# Patient Record
Sex: Male | Born: 1970 | Race: White | Hispanic: No | Smoking: Current some day smoker
Health system: Southern US, Community
[De-identification: ages and names within clinical notes are randomized; demographics above are authoritative.]

## PROBLEM LIST (undated history)

## (undated) DIAGNOSIS — IMO0001 Reserved for inherently not codable concepts without codable children: Secondary | ICD-10-CM

## (undated) DIAGNOSIS — Z789 Other specified health status: Secondary | ICD-10-CM

## (undated) HISTORY — PX: OTHER SURGICAL HISTORY: SHX169

---

## 2002-02-14 ENCOUNTER — Emergency Department (HOSPITAL_COMMUNITY): Admission: EM | Admit: 2002-02-14 | Discharge: 2002-02-14 | Payer: Self-pay | Admitting: Emergency Medicine

## 2002-02-14 ENCOUNTER — Encounter: Payer: Self-pay | Admitting: Emergency Medicine

## 2014-01-20 ENCOUNTER — Emergency Department (HOSPITAL_BASED_OUTPATIENT_CLINIC_OR_DEPARTMENT_OTHER)
Admission: EM | Admit: 2014-01-20 | Discharge: 2014-01-20 | Disposition: A | Discharge status: Home / Self Care | Payer: BC Managed Care – PPO | Attending: Emergency Medicine | Admitting: Emergency Medicine

## 2014-01-20 ENCOUNTER — Encounter (HOSPITAL_BASED_OUTPATIENT_CLINIC_OR_DEPARTMENT_OTHER): Payer: Self-pay | Admitting: Emergency Medicine

## 2014-01-20 DIAGNOSIS — K920 Hematemesis: Secondary | ICD-10-CM | POA: Insufficient documentation

## 2014-01-20 DIAGNOSIS — F17200 Nicotine dependence, unspecified, uncomplicated: Secondary | ICD-10-CM | POA: Insufficient documentation

## 2014-01-20 DIAGNOSIS — Z79899 Other long term (current) drug therapy: Secondary | ICD-10-CM | POA: Insufficient documentation

## 2014-01-20 HISTORY — DX: Reserved for inherently not codable concepts without codable children: IMO0001

## 2014-01-20 HISTORY — DX: Other specified health status: Z78.9

## 2014-01-20 LAB — CBC WITH DIFFERENTIAL/PLATELET
Basophils Absolute: 0 10*3/uL (ref 0.0–0.1)
Basophils Relative: 0 % (ref 0–1)
Eosinophils Absolute: 0 10*3/uL (ref 0.0–0.7)
Eosinophils Relative: 0 % (ref 0–5)
HCT: 44.2 % (ref 39.0–52.0)
Hemoglobin: 15.6 g/dL (ref 13.0–17.0)
Lymphocytes Relative: 15 % (ref 12–46)
Lymphs Abs: 1.8 10*3/uL (ref 0.7–4.0)
MCH: 33.4 pg (ref 26.0–34.0)
MCHC: 35.3 g/dL (ref 30.0–36.0)
MCV: 94.6 fL (ref 78.0–100.0)
Monocytes Absolute: 0.7 10*3/uL (ref 0.1–1.0)
Monocytes Relative: 6 % (ref 3–12)
Neutro Abs: 9.5 10*3/uL — ABNORMAL HIGH (ref 1.7–7.7)
Neutrophils Relative %: 79 % — ABNORMAL HIGH (ref 43–77)
Platelets: 225 10*3/uL (ref 150–400)
RBC: 4.67 MIL/uL (ref 4.22–5.81)
RDW: 13.1 % (ref 11.5–15.5)
WBC: 12 10*3/uL — ABNORMAL HIGH (ref 4.0–10.5)

## 2014-01-20 LAB — I-STAT CHEM 8, ED
BUN: 19 mg/dL (ref 6–23)
CALCIUM ION: 1.23 mmol/L (ref 1.12–1.23)
Chloride: 102 mEq/L (ref 96–112)
Creatinine, Ser: 1.1 mg/dL (ref 0.50–1.35)
Glucose, Bld: 104 mg/dL — ABNORMAL HIGH (ref 70–99)
HCT: 49 % (ref 39.0–52.0)
HEMOGLOBIN: 16.7 g/dL (ref 13.0–17.0)
Potassium: 4 mEq/L (ref 3.7–5.3)
SODIUM: 140 meq/L (ref 137–147)
TCO2: 25 mmol/L (ref 0–100)

## 2014-01-20 MED ORDER — PANTOPRAZOLE SODIUM 20 MG PO TBEC: 20.0000 mg | DELAYED_RELEASE_TABLET | Freq: Every day | ORAL | Status: DC

## 2014-01-20 NOTE — Discharge Instructions (Signed)
Protonix as prescribed.  Followup with gastroenterology. The contact information for [facility] has been provided in this discharge summary.  Return to the emergency department if your symptoms substantially worsen, or you develop severe abdominal pain, high fever, or black, tarry stools.   Hematemesis This condition is the vomiting of blood. CAUSES  This can happen if you have a peptic ulcer or an irritation of the throat, stomach, or small bowel. Vomiting over and over again or swallowing blood from a nosebleed, coughing or facial injury can also result in bloody vomit. Anti-inflammatory pain medicines are a common cause of this potentially dangerous condition. The most serious causes of vomiting blood include:  Ulcers (a bacteria called H. pylori is common cause of ulcers).  Clotting problems.  Alcoholism.  Cirrhosis. TREATMENT  Treatment depends on the cause and the severity of the bleeding. Small amounts of blood streaks in the vomit is not the same as vomiting large amounts of bloody or dark, coffee grounds-like material. Weakness, fainting, dehydration, anemia, and continued alcohol or drug use increase the risk. Examination may include blood, vomit, or stool tests. The presence of bloody or dark stool that tests positive for blood (Hemoccult) means the bleeding has been going on for some time. Endoscopy and imaging studies may be done. Emergency treatment may include:  IV medicines or fluids.  Blood transfusions.  Surgery. Hospital care is required for high risk patients or when IV fluids or blood is needed. Upper GI bleeding can cause shock and death if not controlled. HOME CARE INSTRUCTIONS   Your treatment does not require hospital care at this time.  Remain at rest until your condition improves.  Drink clear liquids as tolerated.  Avoid:  Alcohol.  Nicotine.  Aspirin.  Any other anti-inflammatory medicine (ibuprofen, naproxen, and many others).  Medications to  suppress stomach acid or vomiting may be needed. Take all your medicine as prescribed.  Be sure to see your caregiver for follow-up as recommended. SEEK IMMEDIATE MEDICAL CARE IF:   You have repeated vomiting, dehydration, fainting, or extreme weakness.  You are vomiting large amounts of bloody or dark material.  You pass large, dark or bloody stools. Document Released: 09/27/2004 Document Revised: 11/12/2011 Document Reviewed: 10/13/2008 [facility] Patient Information 2014 [place], [place].  Mallory-Weiss Syndrome Mallory-Weiss syndrome refers to bleeding from tears in the lining of the esophagus near where it meets the stomach. This is often caused by forceful vomiting, retching or coughing. This condition is often associated with alcoholism. Usually the bleeding stops by itself after 24 to 48 hours. Sometimes endoscopic or surgical treatment is needed. This condition is not usually fatal. SYMPTOMS  Vomiting of bright red or black coffee ground like material.  Black, tarry stools.  Low blood pressure causing you to feel faint or experience loss of consciousness. DIAGNOSIS  Definitive diagnosis is by endoscopy. Treatment is usually supportive. Persistent bleeding is uncommon. Sometimes cauterization or injection of epinephrine to stop the bleeding is used during the diagnostic endoscopy. Embolization (obstruction) of the arteries supplying the area of bleeding is sometimes used to stop the bleeding.  An NG tube (naso-gastric tube) may be inserted to determine where the bleeding is coming from.  Often an EGD (esophagogastroduodenoscopy) is done. In this procedure there is a small flexible tube-like telescope (endoscope) put into your mouth, through your esophagus (the food tube leading from your mouth to your stomach), down into your stomach and into the small bowel. Through this your caregiver can see what and where the  problem is. TREATMENT  It is necessary to stop the bleeding as soon as  possible. During the EGD, your caregiver may inject medication into bleeding vessels to clot them.  SEEK IMMEDIATE MEDICAL CARE IF:  You have persistent dizziness, lightheadedness, or fainting.  Your vomiting returns and you have blood in your stools.  You have vomit that is bright red blood or black coffee ground-like blood, bright red blood in the stool or black tarry stools.  You have chest pain.  You cannot eat or drink.  You have nausea or vomiting. MAKE SURE YOU:   Understand these instructions.  Will watch your condition.  Will get help right away if you are not doing well or get worse. Document Released: 01/07/2006 Document Revised: 11/12/2011 Document Reviewed: 02/11/2006 [facility] Patient Information 2014 [place], [place].

## 2014-01-20 NOTE — ED Notes (Signed)
Pt sts woke up 2am feeling nauseated and lightheaded. Pt states went to brush teeth at 0630 and filled sink with vomited that was black/bloody. Pt states he has had diarrhea but he has lived with that for 20-30 years. Denies feeling weak/sob.

## 2014-01-20 NOTE — ED Provider Notes (Signed)
CSN: [id number]     Arrival date & time 01/20/14  0910 History   First MD Initiated Contact with Patient 01/20/14 0932     Chief Complaint  Patient presents with  . Hematemesis     (Consider location/radiation/quality/duration/timing/severity/associated sxs/prior Treatment) HPI Comments: Patient is a 43 year old male with no significant past medical history. Presents with complaints of hematemesis. He states he woke up at about 2 AM feeling nauseated. He states that he forced himself to vomit and felt somewhat better. When he woke up this morning to get ready for work he had one episode of emesis but states that it was black and coffee-ground in appearance. He denies any dark or bloody stools. He denies any fevers or chills. He denies any abdominal pain. He denies any dizziness, lightheadedness, shortness of breath, or if other symptoms.  The history is provided by the patient.    Past Medical History  Diagnosis Date  . Medical history reviewed with no changes    Past Surgical History  Procedure Laterality Date  . No past surgical      History reviewed. No pertinent family history. History  Substance Use Topics  . Smoking status: Current Some Day Smoker -- 0.50 packs/day for 30 years    Types: Cigarettes  . Smokeless tobacco: Not on file     Comment: pt currently trying to quit while using electronic cigarettes  . Alcohol Use: 1.8 oz/week    3 Cans of beer per week     Comment: pt states daily use of alcohol    Review of Systems  All other systems reviewed and are negative.     Allergies  Review of patient's allergies indicates no known allergies.  Home Medications   Prior to Admission medications   Medication Sig Start Date End Date Taking? Authorizing Provider  buPROPion (WELLBUTRIN XL) 300 MG 24 hr tablet Take 300 mg by mouth daily.   Yes Historical Provider, MD  indomethacin (INDOCIN) 25 MG capsule Take 25 mg by mouth 1 day or 1 dose.   Yes Historical Provider, MD   oxyCODONE-acetaminophen (PERCOCET) 7.5-325 MG per tablet Take 1 tablet by mouth every 4 (four) hours as needed for pain.   Yes Historical Provider, MD   BP 154/80  Pulse 82  Temp(Src) 98.7 F (37.1 C) (Oral)  Resp 20  Ht 6' (1.829 m)  Wt 173 lb (78.472 kg)  BMI 23.46 kg/m2  SpO2 98% Physical Exam  Nursing note and vitals reviewed. Constitutional: He is oriented to person, place, and time. He appears well-developed and well-nourished. No distress.  HENT:  Head: Normocephalic and atraumatic.  Mouth/Throat: Oropharynx is clear and moist.  Neck: Normal range of motion. Neck supple.  Cardiovascular: Normal rate, regular rhythm and normal heart sounds.   No murmur heard. Pulmonary/Chest: Effort normal and breath sounds normal. No respiratory distress. He has no wheezes.  Abdominal: Soft. Bowel sounds are normal. He exhibits no distension. There is no tenderness.  Musculoskeletal: Normal range of motion. He exhibits no edema.  Lymphadenopathy:    He has no cervical adenopathy.  Neurological: He is alert and oriented to person, place, and time.  Skin: Skin is warm and dry. He is not diaphoretic.    ED Course  Procedures (including critical care time) Labs Review Labs Reviewed  CBC WITH DIFFERENTIAL  COMPREHENSIVE METABOLIC PANEL  LIPASE, BLOOD    Imaging Review No results found.   EKG Interpretation None      MDM   Final diagnoses:  None    Patient is a 43 year old male who presents with complaints of off the ground emesis that occurred this morning while getting ready for work. She denies abdominal pain, dizziness, shortness of breath, chest pain, or fever. Workup reveals a hemoglobin of 15.6 and BUN and creatinine that are within normal limits. He is not having any melena and I doubt he is actively bleeding. His vital signs are stable and I believe he is appropriate for discharge with GI followup. I will prescribe protonix and followup instructions for GI will be  given.    Geoffery Lyonsouglas Lasondra Hodgkins, MD 01/20/14 1059

## 2015-09-14 ENCOUNTER — Emergency Department (HOSPITAL_BASED_OUTPATIENT_CLINIC_OR_DEPARTMENT_OTHER): Payer: BLUE CROSS/BLUE SHIELD

## 2015-09-14 ENCOUNTER — Emergency Department (HOSPITAL_BASED_OUTPATIENT_CLINIC_OR_DEPARTMENT_OTHER)
Admission: EM | Admit: 2015-09-14 | Discharge: 2015-09-15 | Disposition: A | Discharge status: Home / Self Care | Payer: BLUE CROSS/BLUE SHIELD | Attending: Emergency Medicine | Admitting: Emergency Medicine

## 2015-09-14 ENCOUNTER — Encounter (HOSPITAL_BASED_OUTPATIENT_CLINIC_OR_DEPARTMENT_OTHER): Payer: Self-pay

## 2015-09-14 DIAGNOSIS — R51 Headache: Secondary | ICD-10-CM | POA: Diagnosis present

## 2015-09-14 DIAGNOSIS — Z79899 Other long term (current) drug therapy: Secondary | ICD-10-CM | POA: Diagnosis not present

## 2015-09-14 DIAGNOSIS — R519 Headache, unspecified: Secondary | ICD-10-CM

## 2015-09-14 DIAGNOSIS — F17210 Nicotine dependence, cigarettes, uncomplicated: Secondary | ICD-10-CM | POA: Insufficient documentation

## 2015-09-14 DIAGNOSIS — H53149 Visual discomfort, unspecified: Secondary | ICD-10-CM | POA: Diagnosis not present

## 2015-09-14 MED ORDER — KETOROLAC TROMETHAMINE 30 MG/ML IJ SOLN
30.0000 mg | Freq: Once | INTRAMUSCULAR | Status: AC
  Administered 2015-09-15: 30 mg via INTRAVENOUS
  Filled 2015-09-14: qty 1

## 2015-09-14 MED ORDER — SODIUM CHLORIDE 0.9 % IV BOLUS (SEPSIS)
1000.0000 mL | Freq: Once | INTRAVENOUS | Status: AC
  Administered 2015-09-15: 1000 mL via INTRAVENOUS

## 2015-09-14 MED ORDER — DIPHENHYDRAMINE HCL 50 MG/ML IJ SOLN
25.0000 mg | Freq: Once | INTRAMUSCULAR | Status: AC
  Administered 2015-09-15: 25 mg via INTRAVENOUS
  Filled 2015-09-14: qty 1

## 2015-09-14 MED ORDER — METOCLOPRAMIDE HCL 5 MG/ML IJ SOLN
10.0000 mg | Freq: Once | INTRAMUSCULAR | Status: AC
  Administered 2015-09-15: 10 mg via INTRAVENOUS
  Filled 2015-09-14: qty 2

## 2015-09-14 NOTE — ED Notes (Addendum)
C/o HA x 2 weeks-seen by PCP and urgent care for treatment-pt is grabbing sides of head, pacing, hyperventilating-advised to take slow deep breaths-answers all ?s appropriately-teenage son drove pt to ED

## 2015-09-14 NOTE — ED Provider Notes (Signed)
CSN: [id number]     Arrival date & time 09/14/15  2209 History  By signing my name below, I, Kristopher Brown, attest that this documentation has been prepared under the direction and in the presence of Paula Libra, MD. Electronically Signed: Budd Brown, ED Scribe. 09/14/2015. 11:45 PM.    Chief Complaint  Patient presents with  . Headache   The history is provided by the patient. No language interpreter was used.   HPI Comments: Kristopher Brown is a 45 y.o. male who presents to the Emergency Department complaining of a sudden-onset, episodic, throbbing, headache which first occurred 2 weeks ago while working in his attic. He states his headache "feels like hell" and that the pain radiates throughout his head. He reports associated photophobia and intermittent nausea without vomiting. He is not currently nauseated. He saw his PCP (Dr Jenita Seashore) on 09/05/2015, who gave him oxycodone and intramuscular Toradol, which provided temporary relief. The headache then returned a few days later, causing pt to seek help at an Urgent Care three days ago; he was given a Toradol injection which provided sufficient relief. He was also prescribed Imitrex as well as an anti-emetic, and advised to take 600 mg ibuprofen 5x per day, which he has been taking without relief. He states he has not had a CT scan, nor has his PCP scheduled him for one. There is no associated focal neurologic deficit.  History reviewed. No pertinent past medical history. Past Surgical History  Procedure Laterality Date  . No past surgical      History reviewed. No pertinent family history. Social History  Substance Use Topics  . Smoking status: Current Some Day Smoker -- 0.50 packs/day for 30 years    Types: Cigarettes  . Smokeless tobacco: None  . Alcohol Use: 1.8 oz/week    3 Cans of beer per week     Comment: pt states daily use of alcohol    Review of Systems  All other systems reviewed and are negative.   Allergies   Review of patient's allergies indicates no known allergies.  Home Medications   Prior to Admission medications   Medication Sig Start Date End Date Taking? Authorizing Provider  ibuprofen (ADVIL,MOTRIN) 600 MG tablet Take 600 mg by mouth every 6 (six) hours as needed.   Yes Historical Provider, MD  SUMAtriptan Succinate (IMITREX PO) Take by mouth.   Yes Historical Provider, MD  oxyCODONE-acetaminophen (PERCOCET) 7.5-325 MG per tablet Take 1 tablet by mouth every 4 (four) hours as needed for pain.    Historical Provider, MD   BP 151/113 mmHg  Pulse 104  Temp(Src) 98.6 F (37 C) (Oral)  Resp 20  Ht 6' (1.829 m)  Wt 167 lb (75.751 kg)  BMI 22.64 kg/m2  SpO2 100%   Physical Exam General: Well-developed, well-nourished male in no acute distress; appearance consistent with age of record HENT: normocephalic; atraumatic Eyes: pupils equal, round and reactive to light; extraocular muscles intact Neck: supple Heart: regular rate and rhythm Lungs: clear to auscultation bilaterally Abdomen: soft; nondistended; nontender; bowel sounds present Extremities: No deformity; full range of motion; pulses normal Neurologic: Awake, alert and oriented; motor function intact in all extremities and symmetric; no facial droop Skin: Warm and dry Psychiatric: Agitated   ED Course  Procedures   MDM  Nursing notes and vitals signs, including pulse oximetry, reviewed.  Summary of this visit's results, reviewed by myself:  Imaging Studies: Ct Head Wo Contrast  09/15/2015  CLINICAL DATA:  Intermittent headache  for 2 weeks, severe pain tonight. EXAM: CT HEAD WITHOUT CONTRAST TECHNIQUE: Contiguous axial images were obtained from the base of the skull through the vertex without intravenous contrast. COMPARISON:  None. FINDINGS: No intracranial hemorrhage, mass effect, or midline shift. No hydrocephalus. The basilar cisterns are patent. No evidence of territorial infarct. No intracranial fluid collection.  Calvarium is intact. Included paranasal sinuses and mastoid air cells are well aerated. IMPRESSION: No acute intracranial abnormality. Electronically Signed   By: Rubye OaksMelanie  Ehinger M.D.   On: 09/15/2015 00:30   12:51 AM Pain 2 out of 10 after IV medications. Headache pattern and quality is not classic for either migraines or cluster headaches. He does not have a history of migraines or headaches. Patient was advised to follow-up with his PCP and inquire about a neurology referral. [facility]   I personally performed the services described in this documentation, which was scribed in my presence. The recorded information has been reviewed and is accurate.   Paula LibraJohn Ervey Fallin, MD 09/15/15 [phone]

## 2015-09-15 NOTE — Discharge Instructions (Signed)

## 2016-07-16 IMAGING — CT CT HEAD W/O CM
1 series · 16 of 30 positions shown, 20 images · non-contrast
Comparison: None.

CLINICAL DATA: Intermittent headache for 2 weeks, severe pain
tonight.

EXAM:
CT HEAD WITHOUT CONTRAST
TECHNIQUE: Contiguous axial images were obtained from the base of the skull
through the vertex without intravenous contrast.

[Series 2: head wo · axial · 0.46mm/px · z∈[-154,-19]mm · 16 of 32 slices shown, 20 images]
[im 2/32  brain]
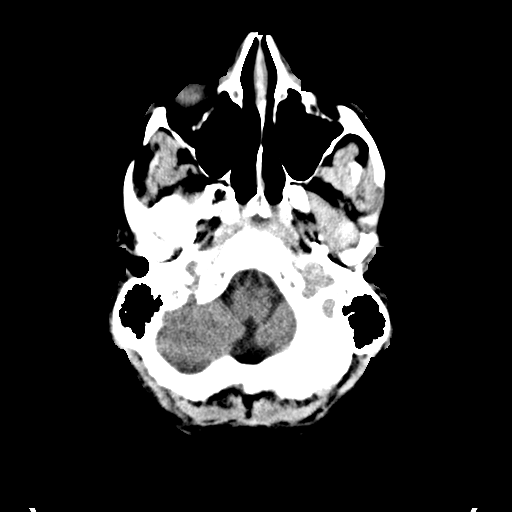
[im 2/32  bone]
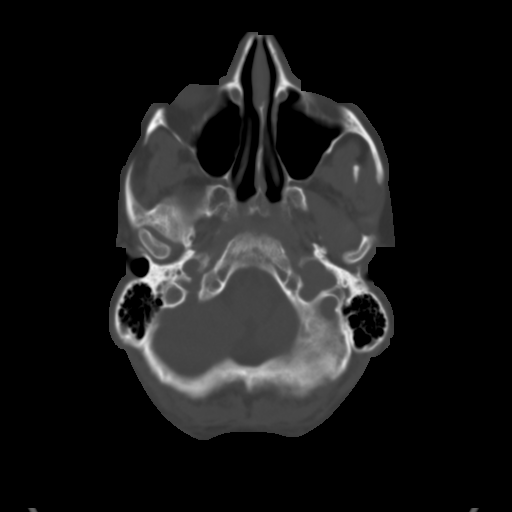
[im 4/32  brain]
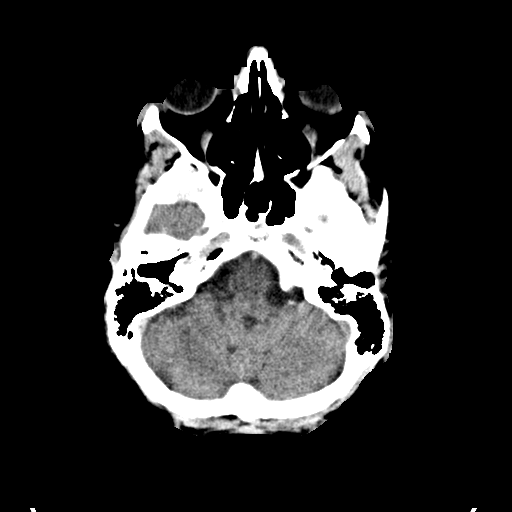
[im 6/32  brain]
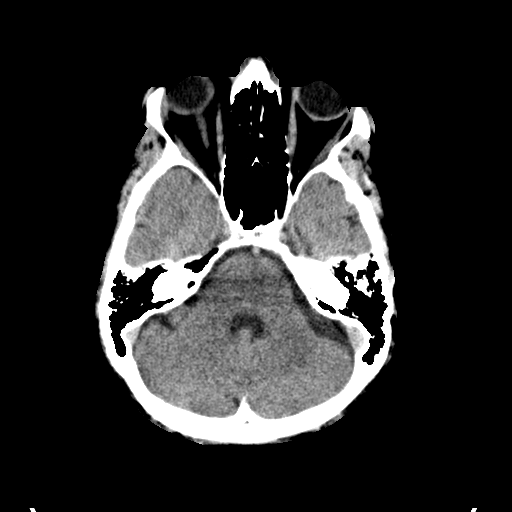
[im 8/32  brain]
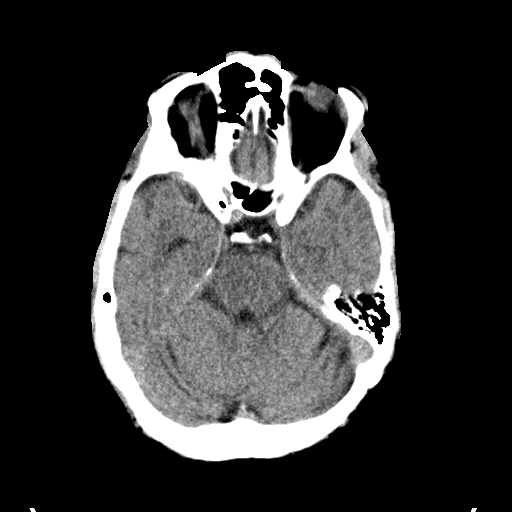
[im 9/32  brain]
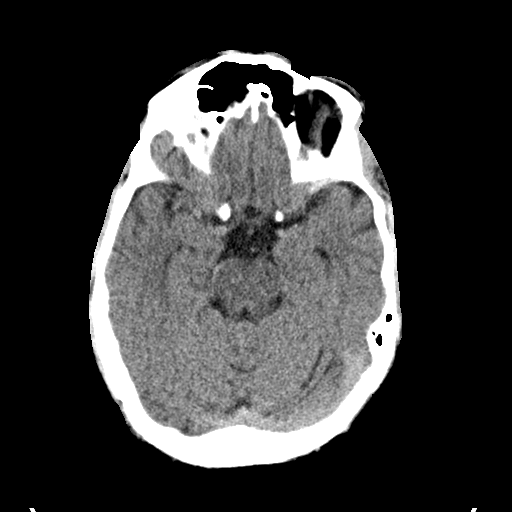
[im 9/32  bone]
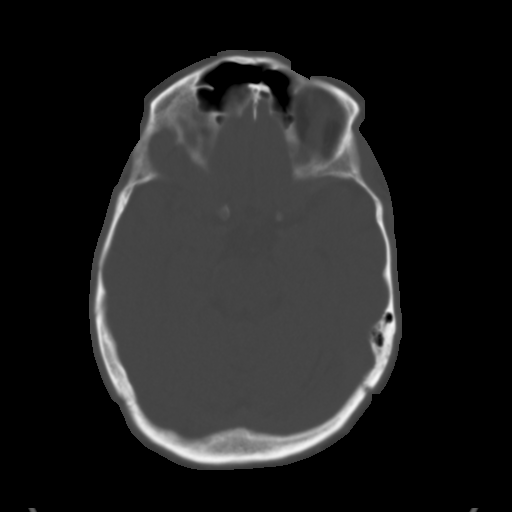
[im 11/32  brain]
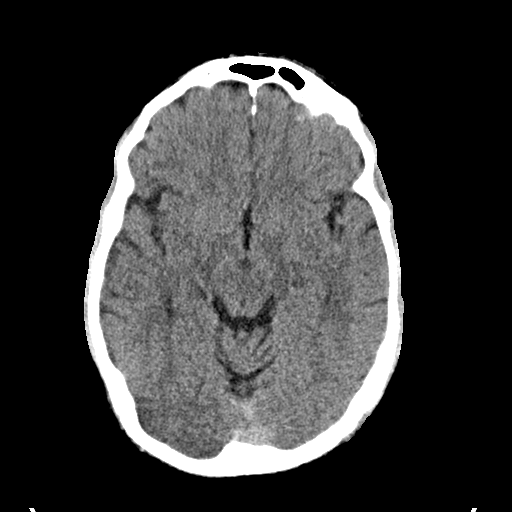
[im 13/32  brain]
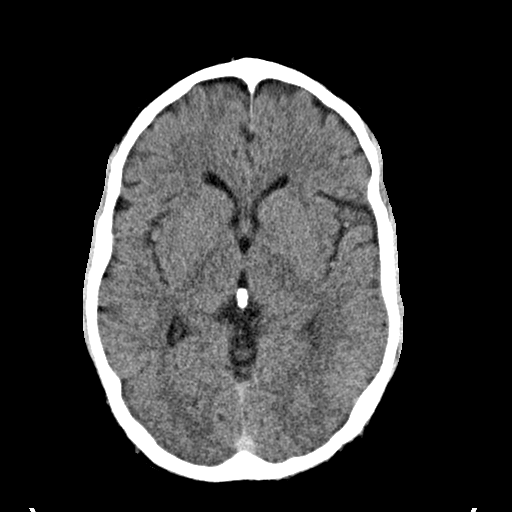
[im 15/32  brain]
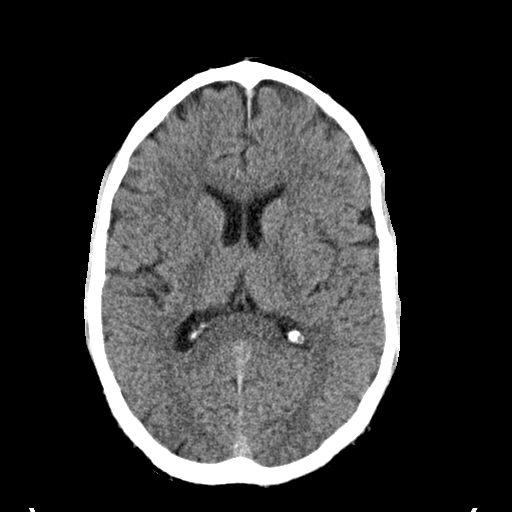
[im 17/32  brain]
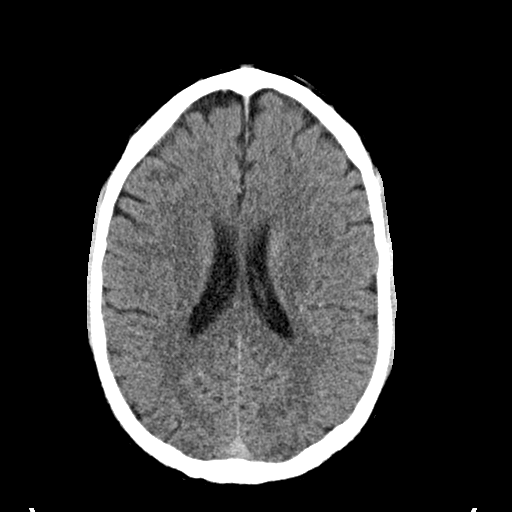
[im 17/32  bone]
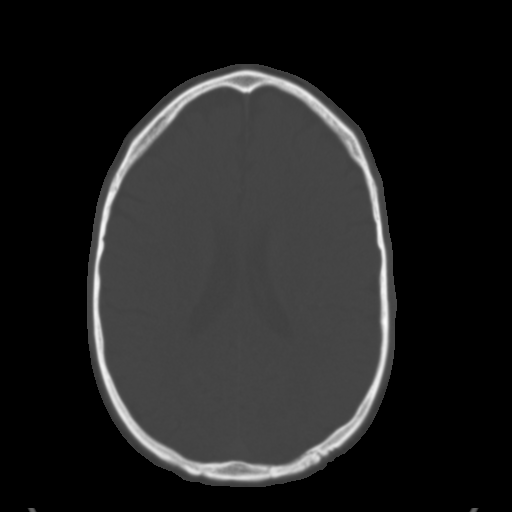
[im 19/32  brain]
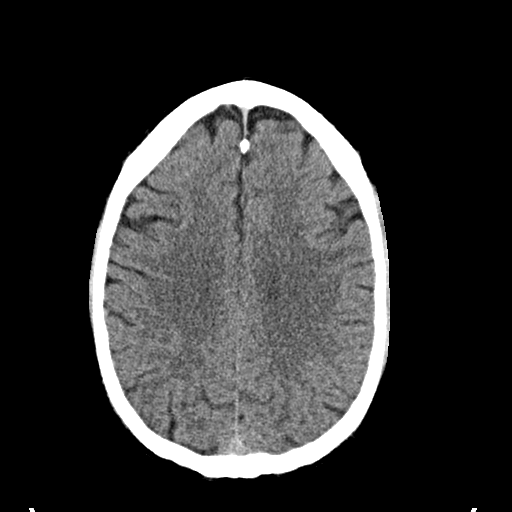
[im 21/32  brain]
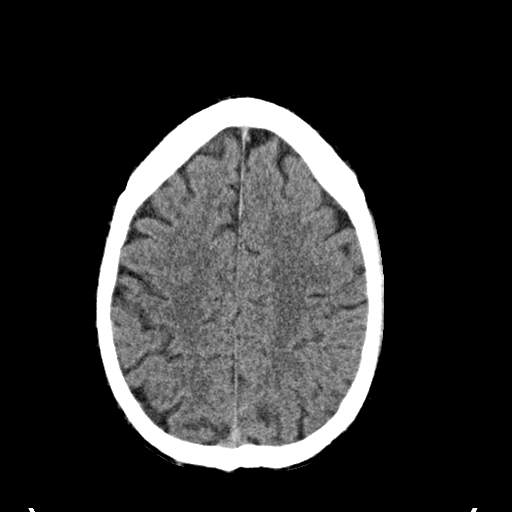
[im 23/32  brain]
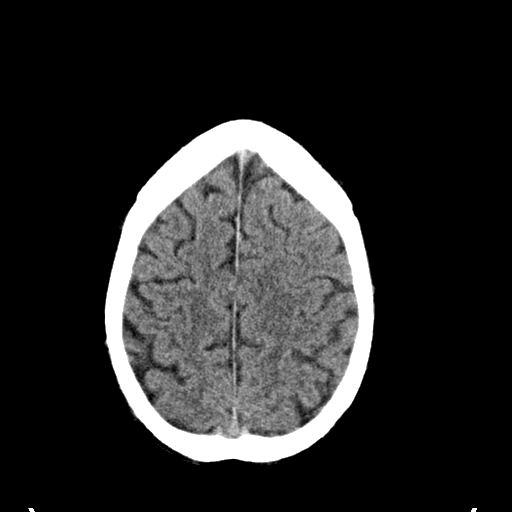
[im 24/32  brain]
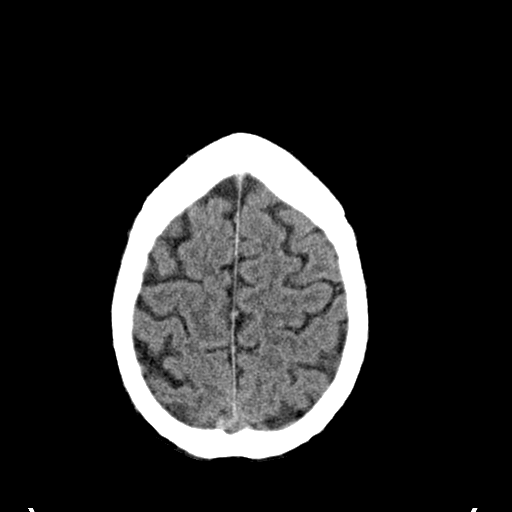
[im 24/32  bone]
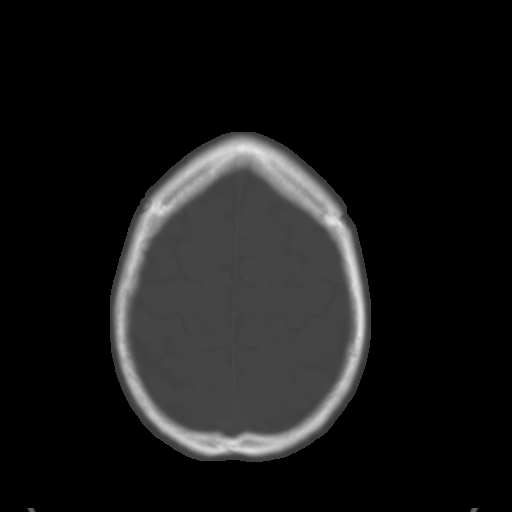
[im 26/32  brain]
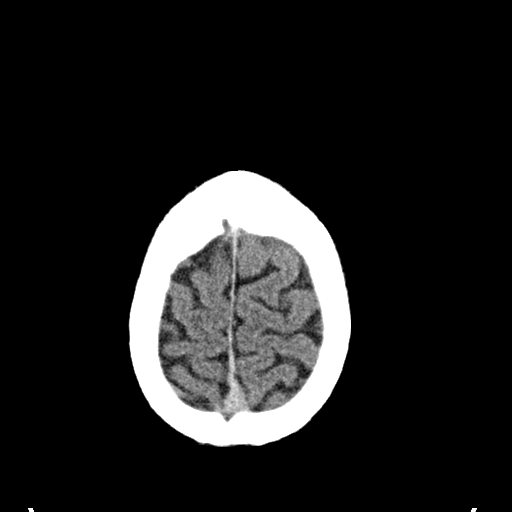
[im 28/32  brain]
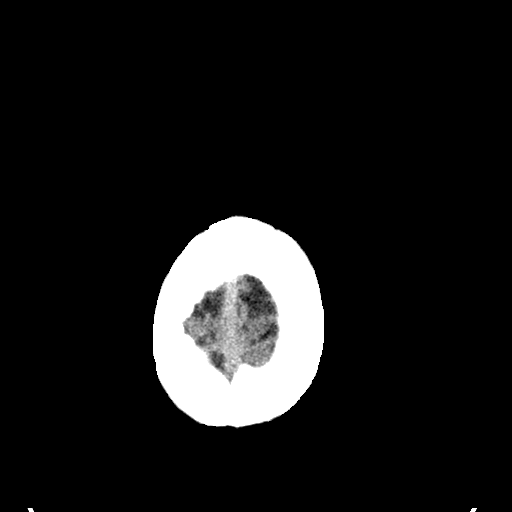
[im 30/32  brain]
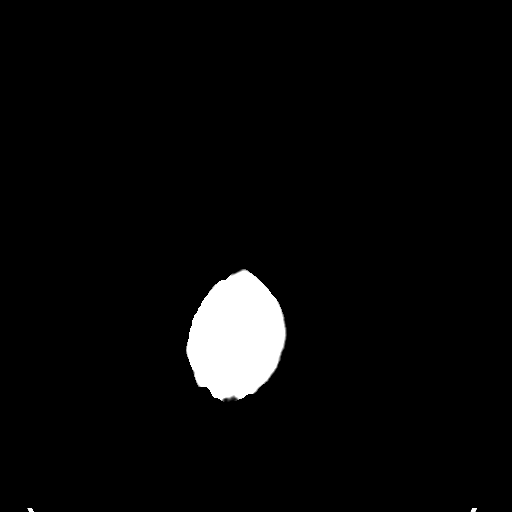

[16 of 30 positions shown; findings below may reference images not displayed]

FINDINGS: No intracranial hemorrhage, mass effect, or midline shift. No
hydrocephalus. The basilar cisterns are patent. No evidence of
territorial infarct. No intracranial fluid collection. Calvarium is
intact. Included paranasal sinuses and mastoid air cells are well
aerated.
IMPRESSION: No acute intracranial abnormality.

## 2019-05-12 ENCOUNTER — Telehealth: Payer: Self-pay | Admitting: *Deleted

## 2019-05-12 NOTE — Telephone Encounter (Signed)
Pt wife called with concerns: Fatigue and rectal bleeding when having BM.  Discussed with wife fatigue likely due to the injection to boost WBC, rectal bleeding pt has an appt with GI on Thursday that wife has just scheduled. Encouraged small frequent meals, hydration and rest. No other concerns at this time.
